# Patient Record
Sex: Female | Born: 1990 | Race: White | Hispanic: No | Marital: Single | Smoking: Never smoker
Health system: Southern US, Community
[De-identification: ages and names within clinical notes are randomized; demographics above are authoritative.]

## PROBLEM LIST (undated history)

## (undated) DIAGNOSIS — M436 Torticollis: Secondary | ICD-10-CM

## (undated) DIAGNOSIS — M549 Dorsalgia, unspecified: Secondary | ICD-10-CM

## (undated) HISTORY — PX: ADENOIDECTOMY: SUR15

## (undated) HISTORY — PX: FOOT SURGERY: SHX648

## (undated) HISTORY — PX: TONSILLECTOMY: SUR1361

---

## 2011-08-23 DIAGNOSIS — K5289 Other specified noninfective gastroenteritis and colitis: Secondary | ICD-10-CM | POA: Insufficient documentation

## 2011-08-23 DIAGNOSIS — R112 Nausea with vomiting, unspecified: Secondary | ICD-10-CM | POA: Insufficient documentation

## 2011-08-23 DIAGNOSIS — R109 Unspecified abdominal pain: Secondary | ICD-10-CM | POA: Insufficient documentation

## 2011-08-23 DIAGNOSIS — R197 Diarrhea, unspecified: Secondary | ICD-10-CM | POA: Insufficient documentation

## 2011-08-24 ENCOUNTER — Encounter (HOSPITAL_COMMUNITY): Payer: Self-pay | Admitting: *Deleted

## 2011-08-24 ENCOUNTER — Emergency Department (HOSPITAL_COMMUNITY)
Admission: EM | Admit: 2011-08-24 | Discharge: 2011-08-24 | Disposition: A | Discharge status: Home / Self Care | Payer: 59 | Attending: Emergency Medicine | Admitting: Emergency Medicine

## 2011-08-24 DIAGNOSIS — K529 Noninfective gastroenteritis and colitis, unspecified: Secondary | ICD-10-CM

## 2011-08-24 HISTORY — DX: Dorsalgia, unspecified: M54.9

## 2011-08-24 HISTORY — DX: Torticollis: M43.6

## 2011-08-24 LAB — DIFFERENTIAL
Eosinophils Absolute: 0.1 10*3/uL (ref 0.0–0.7)
Eosinophils Relative: 1 % (ref 0–5)
Lymphs Abs: 1.2 10*3/uL (ref 0.7–4.0)
Monocytes Relative: 9 % (ref 3–12)

## 2011-08-24 LAB — CBC
HCT: 37.1 % (ref 36.0–46.0)
Hemoglobin: 13.1 g/dL (ref 12.0–15.0)
MCH: 31.9 pg (ref 26.0–34.0)
MCV: 90.3 fL (ref 78.0–100.0)
RBC: 4.11 MIL/uL (ref 3.87–5.11)

## 2011-08-24 LAB — BASIC METABOLIC PANEL
BUN: 8 mg/dL (ref 6–23)
Calcium: 9.2 mg/dL (ref 8.4–10.5)
GFR calc non Af Amer: >90 mL/min (ref >90)
Glucose, Bld: 118 mg/dL — ABNORMAL HIGH (ref 70–99)

## 2011-08-24 MED ORDER — GLYCOPYRROLATE 0.2 MG/ML IJ SOLN
0.2000 mg | Freq: Once | INTRAMUSCULAR | Status: AC
  Administered 2011-08-24: 0.2 mg via INTRAVENOUS
  Filled 2011-08-24: qty 1

## 2011-08-24 MED ORDER — SODIUM CHLORIDE 0.9 % IV BOLUS (SEPSIS)
1000.0000 mL | Freq: Once | INTRAVENOUS | Status: AC
  Administered 2011-08-24: 1000 mL via INTRAVENOUS

## 2011-08-24 MED ORDER — ONDANSETRON HCL 4 MG/2ML IJ SOLN
4.0000 mg | Freq: Once | INTRAMUSCULAR | Status: AC
  Administered 2011-08-24: 4 mg via INTRAVENOUS
  Filled 2011-08-24: qty 2

## 2011-08-24 NOTE — ED Notes (Signed)
Attempted twice for an iv access without success.

## 2011-08-24 NOTE — ED Notes (Signed)
C/o nausea, followed by abd pain, nausea onset around 1500, abd pain onset around 1600, also vd. Last emesis 2100, last BM (in triage, watery,yellowish), last ate 1600. "feel warm, but unsure of fever".
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
## 2013-03-12 ENCOUNTER — Other Ambulatory Visit: Payer: Self-pay | Admitting: Internal Medicine

## 2013-03-12 DIAGNOSIS — R140 Abdominal distension (gaseous): Secondary | ICD-10-CM

## 2013-03-12 DIAGNOSIS — R197 Diarrhea, unspecified: Secondary | ICD-10-CM

## 2013-03-13 ENCOUNTER — Ambulatory Visit
Admission: RE | Admit: 2013-03-13 | Discharge: 2013-03-13 | Disposition: A | Discharge status: Home / Self Care | Payer: 59 | Source: Ambulatory Visit | Attending: Internal Medicine | Admitting: Internal Medicine

## 2013-03-13 ENCOUNTER — Other Ambulatory Visit: Payer: 59

## 2013-03-13 DIAGNOSIS — R197 Diarrhea, unspecified: Secondary | ICD-10-CM

## 2013-03-13 DIAGNOSIS — R140 Abdominal distension (gaseous): Secondary | ICD-10-CM

## 2013-07-14 ENCOUNTER — Emergency Department (HOSPITAL_COMMUNITY)
Admission: EM | Admit: 2013-07-14 | Discharge: 2013-07-14 | Disposition: A | Discharge status: Home / Self Care | Payer: 59 | Source: Home / Self Care

## 2013-07-14 ENCOUNTER — Encounter (HOSPITAL_COMMUNITY): Payer: Self-pay | Admitting: Emergency Medicine

## 2013-07-14 DIAGNOSIS — R229 Localized swelling, mass and lump, unspecified: Secondary | ICD-10-CM
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
## 2013-07-14 NOTE — ED Notes (Signed)
C/o abscess  States she fell 6 months ago and ever since then she has had the mass on her buttocks States she has tried stretching and massages but no relief.

## 2013-07-14 NOTE — ED Provider Notes (Signed)
Medical screening examination/treatment/procedure(s) were performed by non-physician practitioner and as supervising physician I was immediately available for consultation/collaboration.  Haroldine Redler, M.D.  Shynia Daleo C Joselynne Killam, MD 07/14/13 1525 

## 2014-05-12 ENCOUNTER — Other Ambulatory Visit: Payer: Self-pay | Admitting: Endocrinology

## 2014-05-12 DIAGNOSIS — E041 Nontoxic single thyroid nodule: Secondary | ICD-10-CM

## 2014-05-13 ENCOUNTER — Ambulatory Visit
Admission: RE | Admit: 2014-05-13 | Discharge: 2014-05-13 | Disposition: A | Discharge status: Home / Self Care | Payer: 59 | Source: Ambulatory Visit | Attending: Endocrinology | Admitting: Endocrinology

## 2014-05-13 DIAGNOSIS — E041 Nontoxic single thyroid nodule: Secondary | ICD-10-CM

## 2014-07-02 IMAGING — US US ABDOMEN COMPLETE
1 series · 14 of 25 positions shown · non-contrast
Comparison: None.

CLINICAL DATA: Abdominal bloating with diarrhea for 1 month.

EXAM:
ABDOMEN ULTRASOUND

[Series 1: us abdomen complete · 0.26mm/px · 14 of 73 slices shown]
[im 1/73]
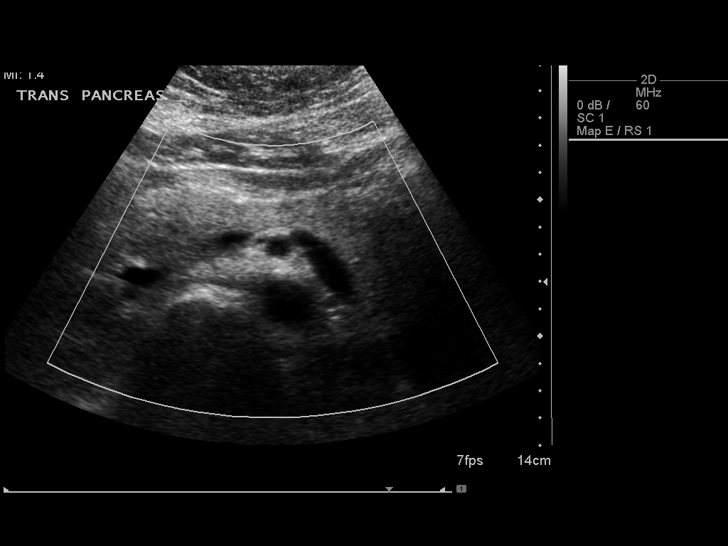
[im 7/73]
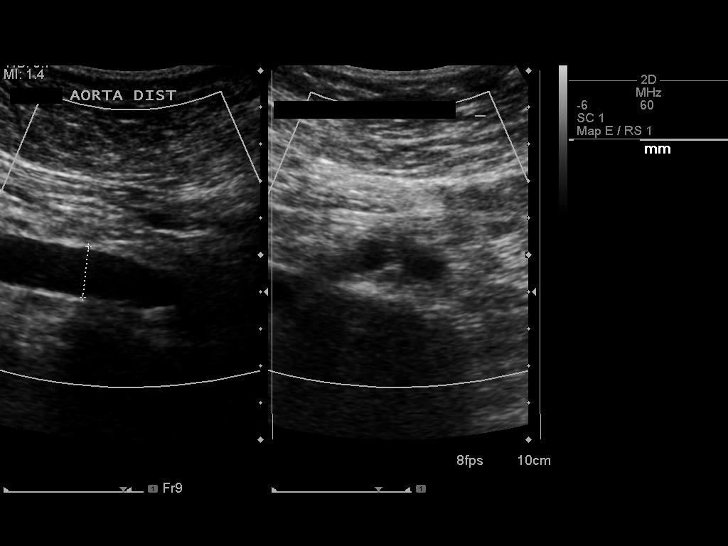
[im 13/73]
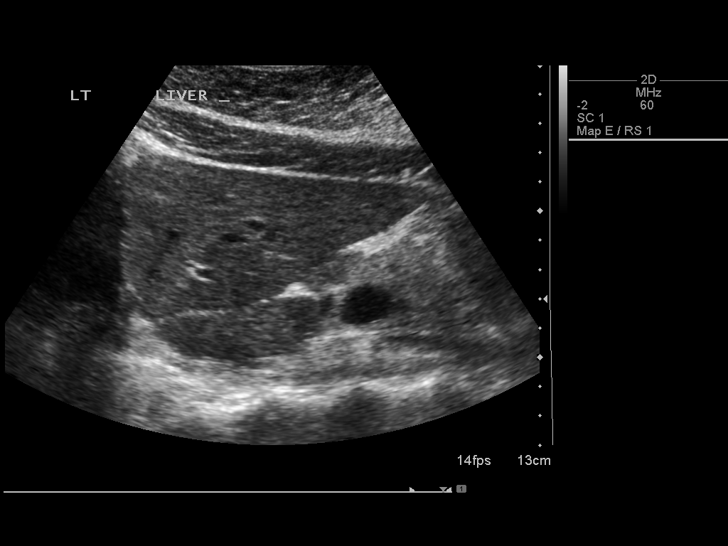
[im 19/73]
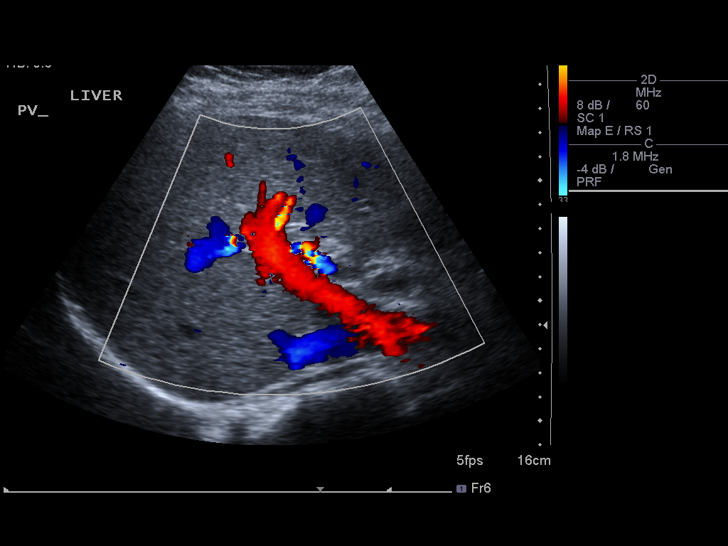
[im 25/73]
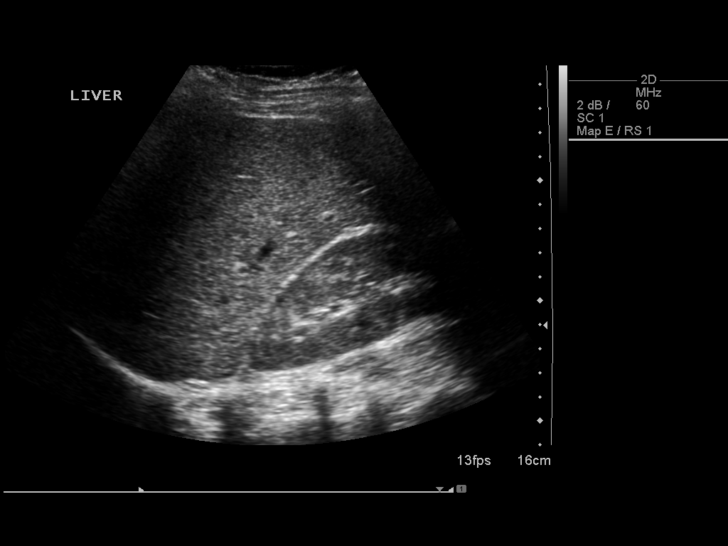
[im 28/73]
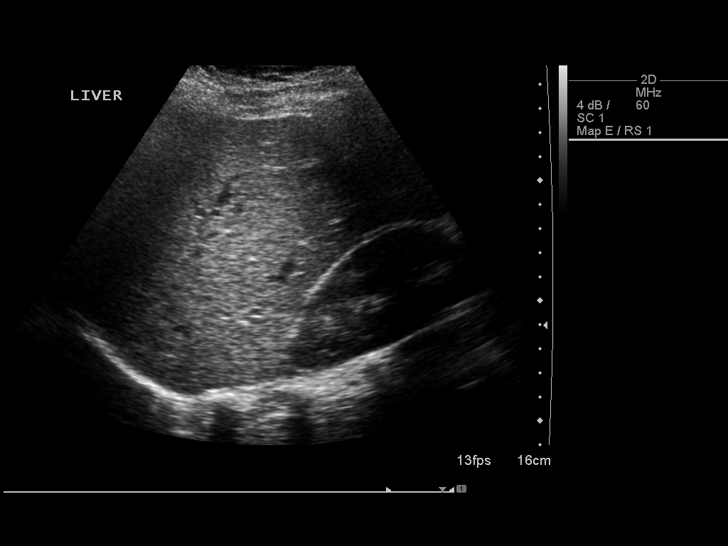
[im 34/73]
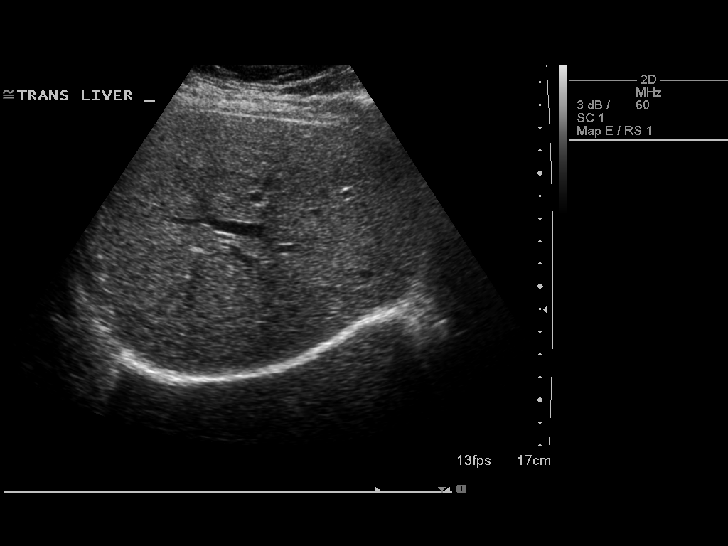
[im 40/73]
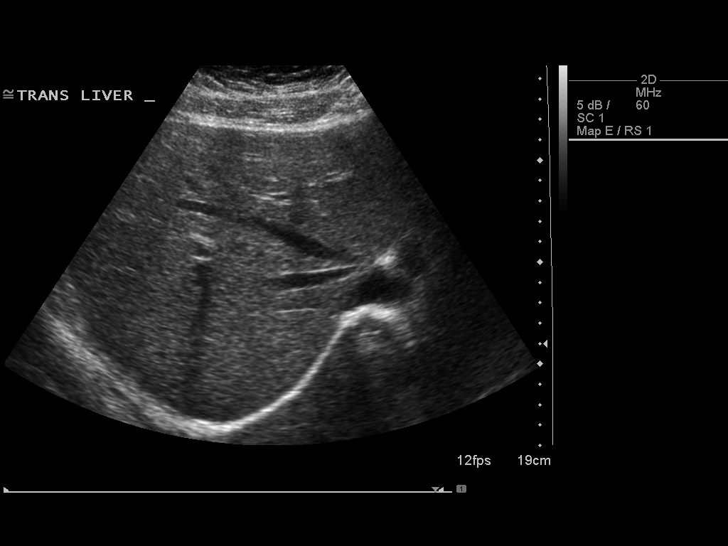
[im 46/73]
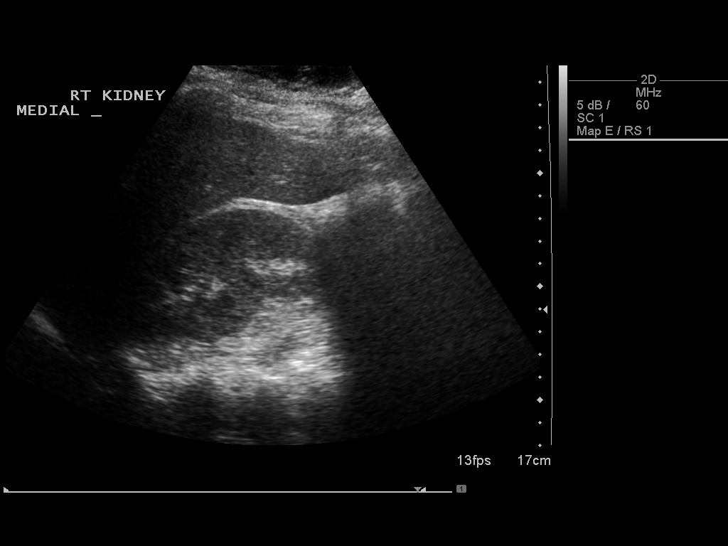
[im 49/73]
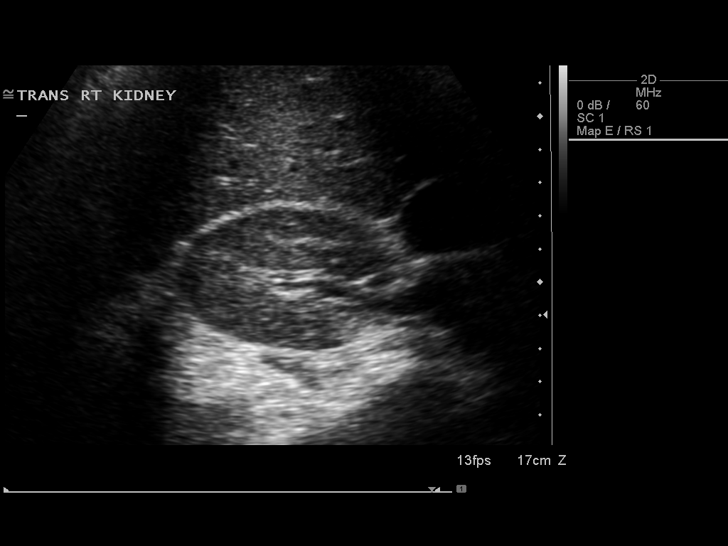
[im 55/73]
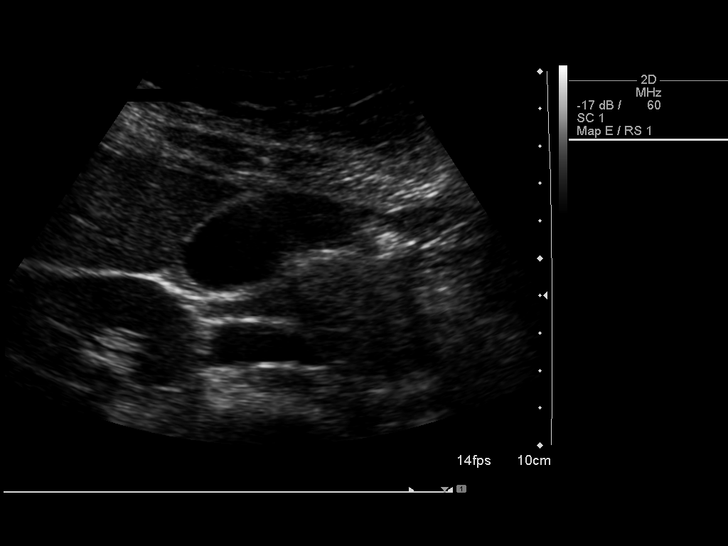
[im 61/73]
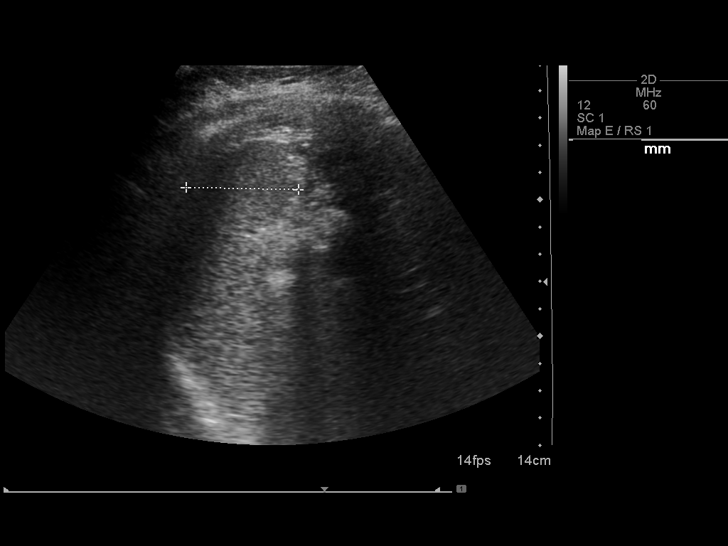
[im 67/73]
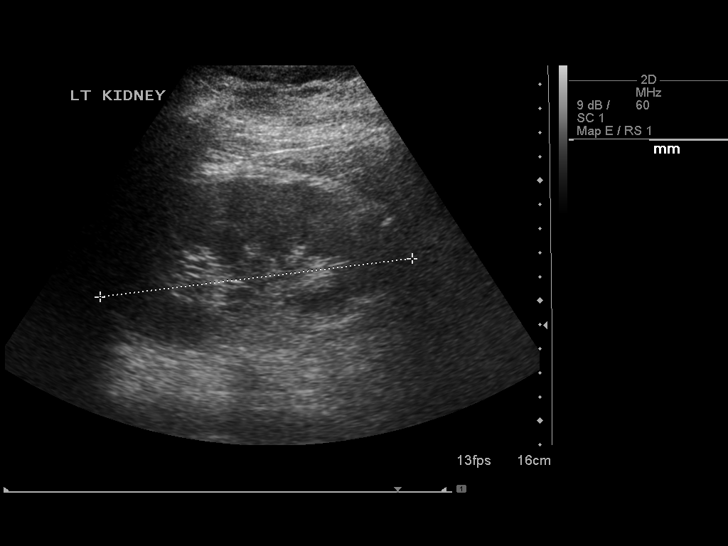
[im 73/73]
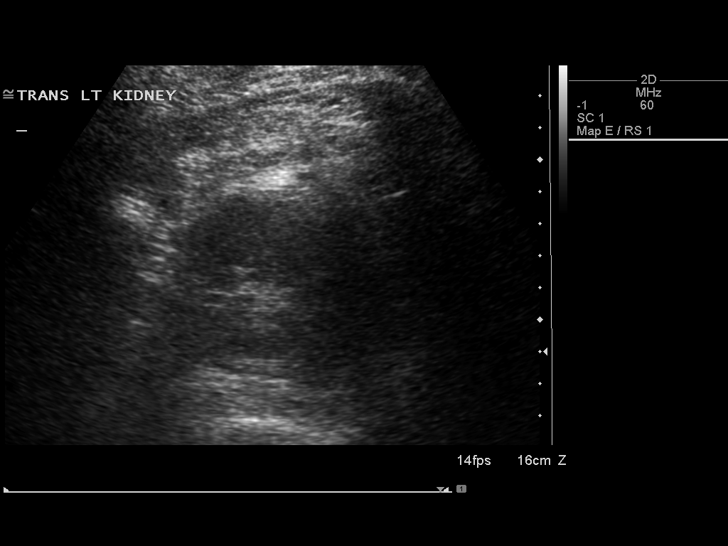

[14 of 25 positions shown; findings below may reference images not displayed]

FINDINGS: Gallbladder

No gallstones or wall thickening. Negative sonographic Murphy's
sign.

Common bile duct

Diameter: 2.4 mm. No intraductal calculi demonstrated.

Liver

No focal lesion identified. Within normal limits in parenchymal
echogenicity.

IVC

No abnormality visualized.

Pancreas

Visualized portion unremarkable.

Spleen

Size and appearance within normal limits.

Right Kidney

Length: 12.3 cm. Echogenicity within normal limits. No mass or
hydronephrosis visualized.

Left Kidney

Length: 13.1 cm. Echogenicity within normal limits. No mass or
hydronephrosis visualized.

Abdominal aorta

No aneurysm visualized.
IMPRESSION: Negative abdominal ultrasound.

## 2015-03-04 ENCOUNTER — Other Ambulatory Visit: Payer: Self-pay | Admitting: Internal Medicine

## 2015-03-04 DIAGNOSIS — N649 Disorder of breast, unspecified: Secondary | ICD-10-CM

## 2015-03-10 ENCOUNTER — Other Ambulatory Visit: Payer: Self-pay

## 2015-09-01 IMAGING — US US SOFT TISSUE HEAD/NECK
1 series · 14 of 25 positions shown · non-contrast
Comparison: None.

CLINICAL DATA: Thyroid nodule.

EXAM:
THYROID ULTRASOUND
TECHNIQUE: Ultrasound examination of the thyroid gland and adjacent soft
tissues was performed.

[Series 1: us soft tissue head/neck · 0.05mm/px · 14 of 47 slices shown]
[im 1/47]
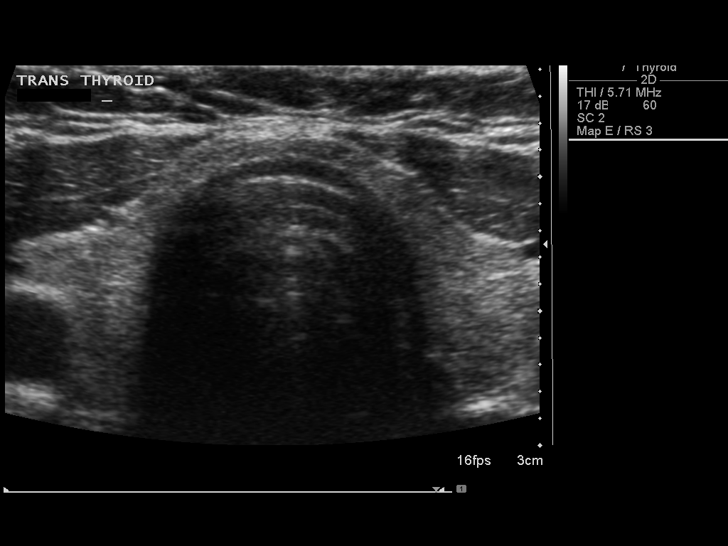
[im 4/47]
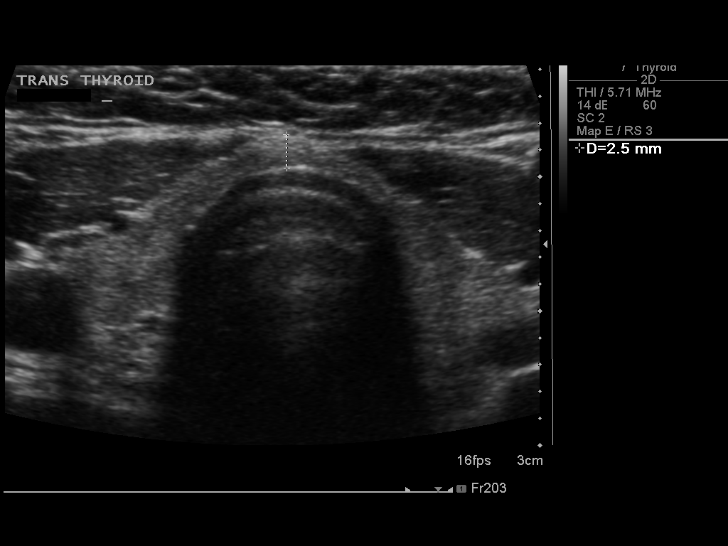
[im 8/47]
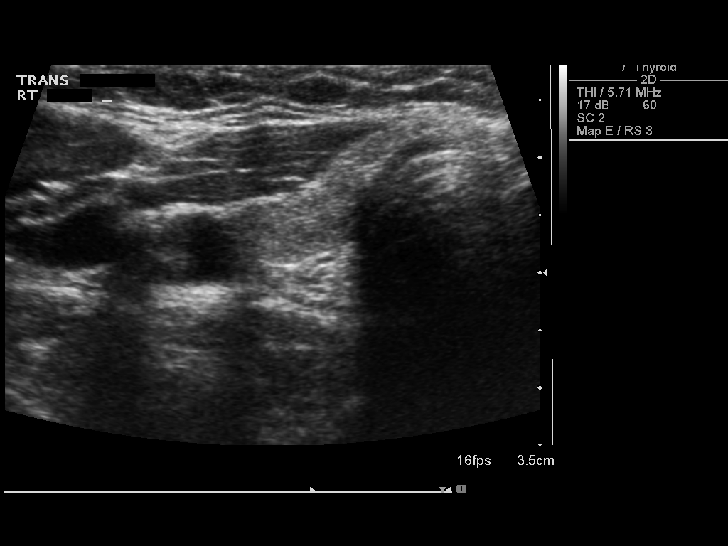
[im 12/47]
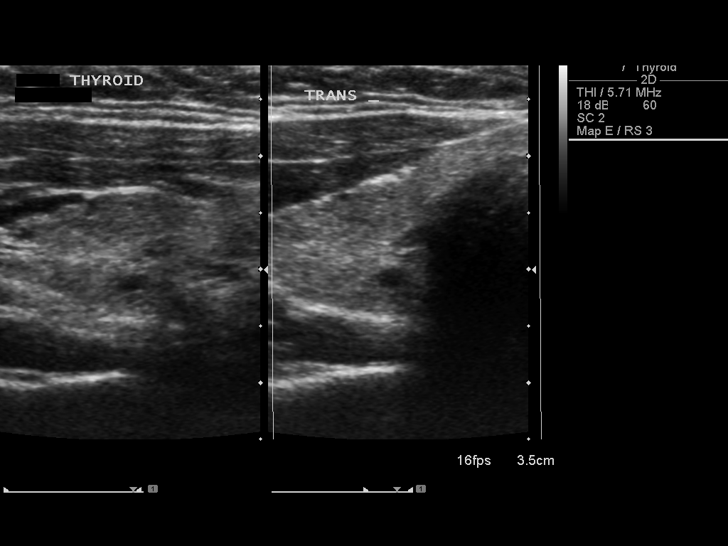
[im 16/47]
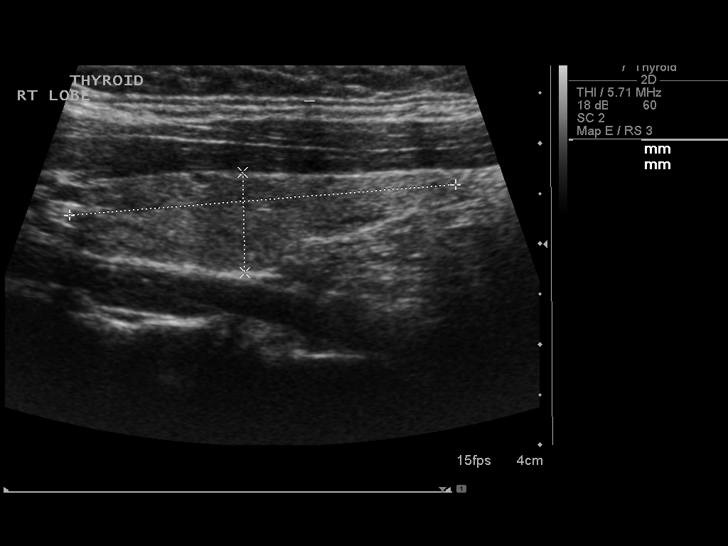
[im 18/47]
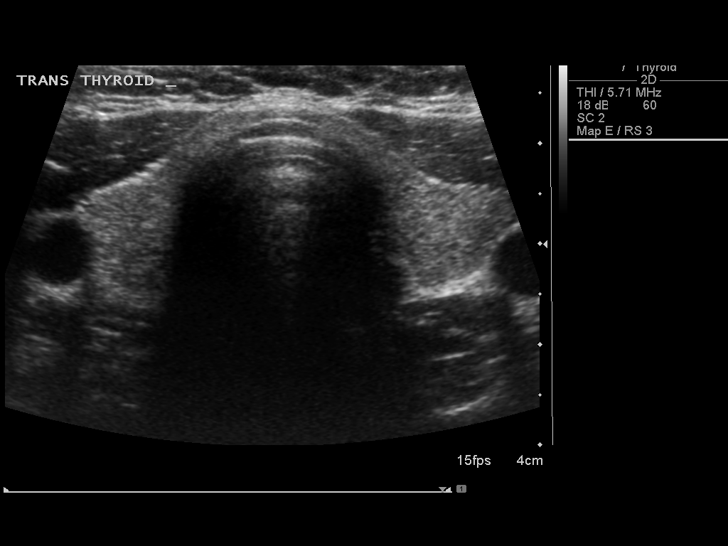
[im 22/47]
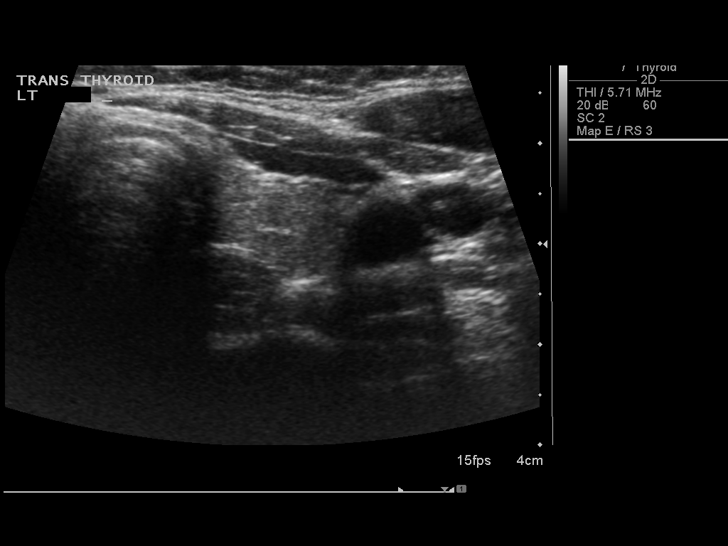
[im 25/47]
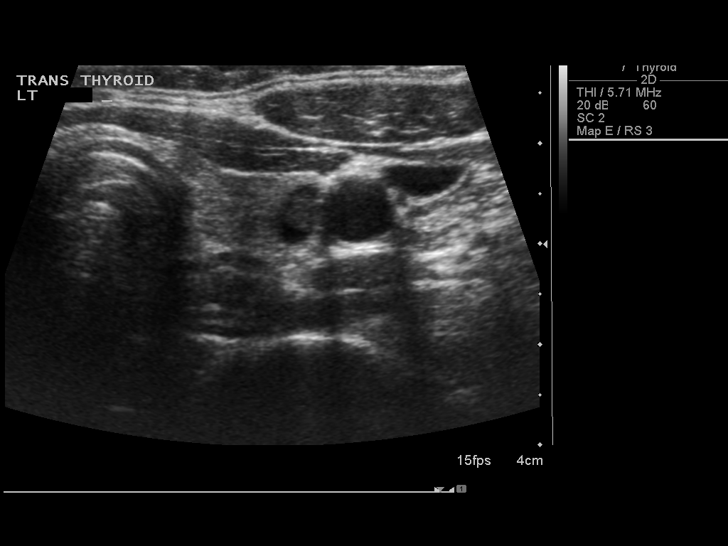
[im 29/47]
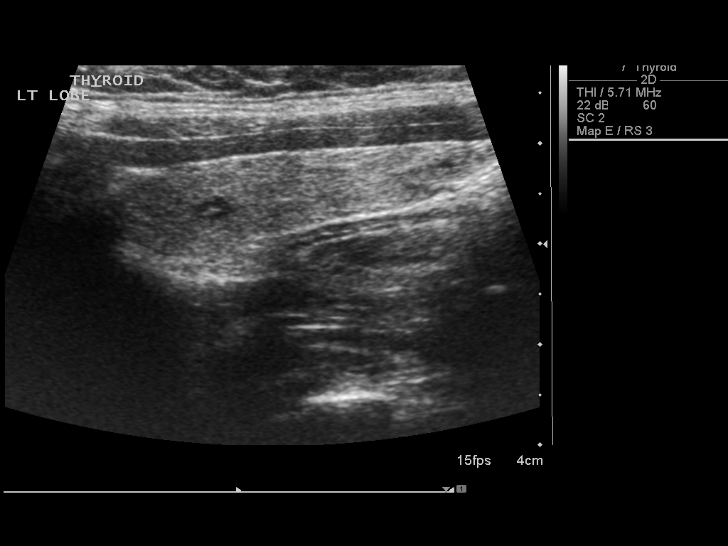
[im 31/47]
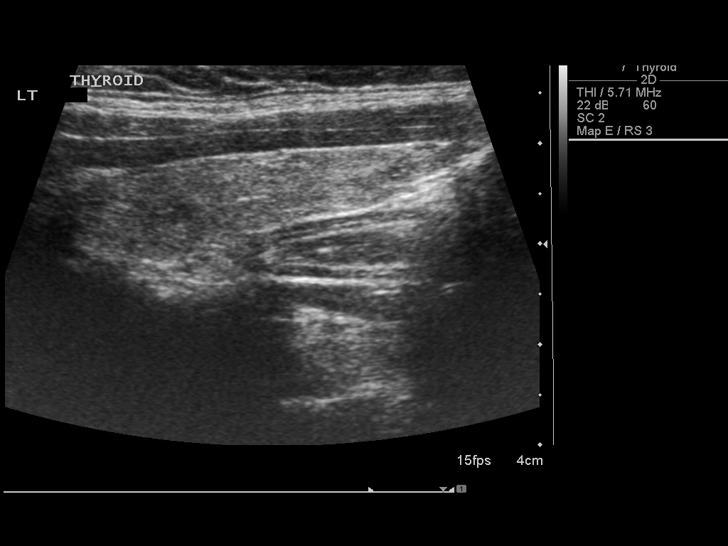
[im 35/47]
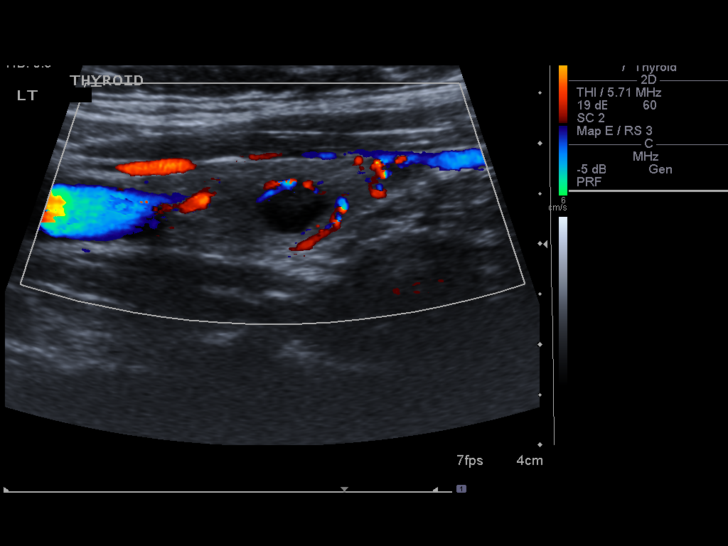
[im 39/47]
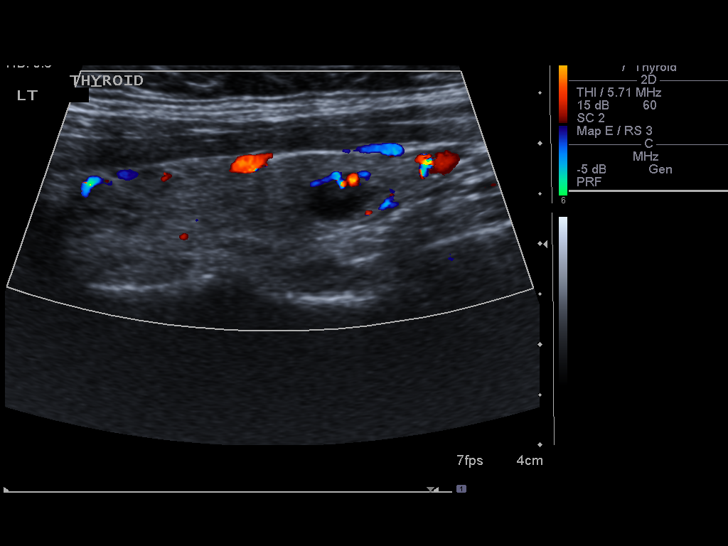
[im 43/47]
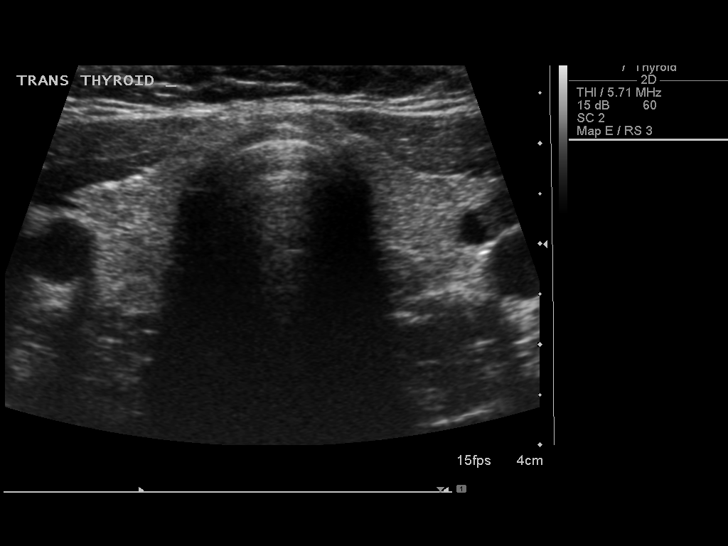
[im 47/47]
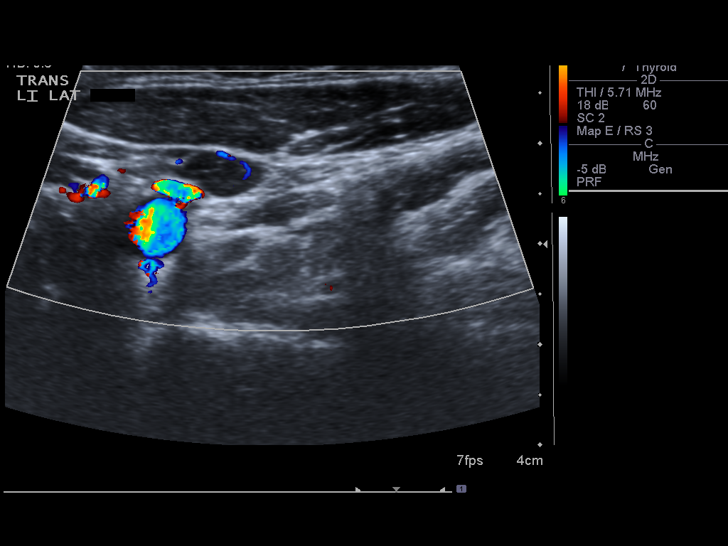

[14 of 25 positions shown; findings below may reference images not displayed]

FINDINGS: Right thyroid lobe

Measurements: 3.9 x 1.2 x 1.0 cm. 2 mm cystic nodule is noted in
midpole.

Left thyroid lobe

Measurements: 4.1 x 1.2 x 0.9 cm. Multiple nodules are noted. The
largest is 8 mm cystic nodule seen in inferior pole.

Isthmus

Thickness: 2.5 mm.  No nodules visualized.

Lymphadenopathy

None visualized.
IMPRESSION: Small bilateral thyroid nodules are noted. No significant
abnormality seen in the thyroid gland.

## 2016-08-14 ENCOUNTER — Other Ambulatory Visit: Payer: Self-pay | Admitting: Obstetrics and Gynecology

## 2016-08-14 DIAGNOSIS — N644 Mastodynia: Secondary | ICD-10-CM

## 2016-08-28 ENCOUNTER — Inpatient Hospital Stay
Admission: RE | Admit: 2016-08-28 | Discharge: 2016-08-28 | Disposition: A | Discharge status: Home / Self Care | Payer: Self-pay | Source: Ambulatory Visit | Attending: Obstetrics and Gynecology | Admitting: Obstetrics and Gynecology
